# Patient Record
Sex: Female | Born: 1960 | Race: White | Hispanic: No | Smoking: Never smoker
Health system: Southern US, Community
[De-identification: ages and names within clinical notes are randomized; demographics above are authoritative.]

## PROBLEM LIST (undated history)

## (undated) DIAGNOSIS — M1990 Unspecified osteoarthritis, unspecified site: Secondary | ICD-10-CM

---

## 2006-07-02 ENCOUNTER — Encounter: Admission: RE | Admit: 2006-07-02 | Discharge: 2006-07-02 | Payer: Self-pay | Admitting: Occupational Medicine

## 2006-10-16 ENCOUNTER — Ambulatory Visit (HOSPITAL_COMMUNITY): Admission: RE | Admit: 2006-10-16 | Discharge: 2006-10-16 | Payer: Self-pay | Admitting: Orthopedic Surgery

## 2008-02-04 ENCOUNTER — Encounter: Admission: RE | Admit: 2008-02-04 | Discharge: 2008-02-04 | Payer: Self-pay | Admitting: Sports Medicine

## 2008-02-18 ENCOUNTER — Encounter: Admission: RE | Admit: 2008-02-18 | Discharge: 2008-02-18 | Payer: Self-pay | Admitting: Sports Medicine

## 2008-03-02 ENCOUNTER — Encounter: Admission: RE | Admit: 2008-03-02 | Discharge: 2008-03-02 | Payer: Self-pay | Admitting: Sports Medicine

## 2008-12-30 ENCOUNTER — Encounter: Admission: RE | Admit: 2008-12-30 | Discharge: 2008-12-30 | Payer: Self-pay | Admitting: Otolaryngology

## 2009-10-04 ENCOUNTER — Emergency Department (HOSPITAL_COMMUNITY): Admission: EM | Admit: 2009-10-04 | Discharge: 2009-10-04 | Payer: Self-pay | Admitting: Family Medicine

## 2010-06-06 ENCOUNTER — Encounter: Admission: RE | Admit: 2010-06-06 | Discharge: 2010-06-06 | Payer: Self-pay | Admitting: Otolaryngology

## 2010-11-21 ENCOUNTER — Inpatient Hospital Stay (INDEPENDENT_AMBULATORY_CARE_PROVIDER_SITE_OTHER)
Admission: RE | Admit: 2010-11-21 | Discharge: 2010-11-21 | Disposition: A | Discharge status: Home / Self Care | Payer: Medicaid Other | Source: Ambulatory Visit | Attending: Family Medicine | Admitting: Family Medicine

## 2010-11-21 DIAGNOSIS — J309 Allergic rhinitis, unspecified: Secondary | ICD-10-CM

## 2012-04-17 ENCOUNTER — Emergency Department (HOSPITAL_COMMUNITY)
Admission: EM | Admit: 2012-04-17 | Discharge: 2012-04-17 | Disposition: A | Discharge status: Home / Self Care | Payer: Medicaid Other | Source: Home / Self Care

## 2012-04-17 ENCOUNTER — Encounter (HOSPITAL_COMMUNITY): Payer: Self-pay

## 2012-04-17 DIAGNOSIS — J069 Acute upper respiratory infection, unspecified: Secondary | ICD-10-CM

## 2012-04-17 DIAGNOSIS — J309 Allergic rhinitis, unspecified: Secondary | ICD-10-CM

## 2012-04-17 NOTE — ED Notes (Signed)
C/o several day duration of congestion, sinus discomfort; NAD; c/o no relief w OTC medications

## 2012-04-17 NOTE — ED Provider Notes (Signed)
History     CSN: [id number]  Arrival date & time 04/17/12  1319   None     Chief Complaint  Patient presents with  . URI    (Consider location/radiation/quality/duration/timing/severity/associated sxs/prior treatment) Patient is a 51 y.o. female presenting with URI. The history is provided by the patient.  URI The primary symptoms include fatigue, cough and nausea. Primary symptoms do not include fever, headaches, ear pain, sore throat, swollen glands, wheezing, abdominal pain, vomiting, myalgias, arthralgias or rash. This is a recurrent problem. The problem has not changed since onset. Symptoms associated with the illness include congestion and rhinorrhea. The illness is not associated with chills, facial pain or sinus pressure.    History reviewed. No pertinent past medical history.  History reviewed. No pertinent past surgical history.  History reviewed. No pertinent family history.  History  Substance Use Topics  . Smoking status: Not on file  . Smokeless tobacco: Not on file  . Alcohol Use: Not on file    OB History    Grav Para Term Preterm Abortions TAB SAB Ect Mult Living                  Review of Systems  Constitutional: Positive for fatigue. Negative for fever and chills.  HENT: Positive for congestion and rhinorrhea. Negative for ear pain, sore throat and sinus pressure.   Respiratory: Positive for cough. Negative for chest tightness and wheezing.   Cardiovascular: Negative for chest pain and palpitations.  Gastrointestinal: Positive for nausea. Negative for vomiting and abdominal pain.  Musculoskeletal: Negative for myalgias and arthralgias.  Skin: Negative for rash and wound.  Neurological: Negative for syncope, numbness and headaches.    Allergies  Review of patient's allergies indicates no known allergies.  Home Medications  No current outpatient prescriptions on file.  BP 121/69  Pulse 95  Temp 98.6 F (37 C) (Oral)  Resp 14  SpO2  97%  Physical Exam  Constitutional: She is oriented to person, place, and time. She appears well-developed. No distress.  HENT:  Right Ear: External ear normal.  Left Ear: External ear normal.  Nose: Nose normal.  Mouth/Throat: Oropharynx is clear and moist. No oropharyngeal exudate.  Eyes: EOM are normal. Pupils are equal, round, and reactive to light. Right eye exhibits discharge. Left eye exhibits no discharge.  Neck: Normal range of motion. Neck supple.  Cardiovascular: Normal rate and regular rhythm.   No murmur heard. Pulmonary/Chest: Effort normal and breath sounds normal. She has no wheezes.  Abdominal: Soft. There is no tenderness.  Musculoskeletal: Normal range of motion. She exhibits no tenderness.  Lymphadenopathy:    She has no cervical adenopathy.  Neurological: She is alert and oriented to person, place, and time. No cranial nerve deficit.  Skin: Skin is warm and dry.    ED Course  Procedures (including critical care time)  Labs Reviewed - No data to display No results found.   1. URI (upper respiratory infection)   2. Allergic rhinitis       MDM  Reassurance CPAP 10 mg one by mouth every 4-6 hours when necessary congestion Allegra 60 mg twice a day or 180 mg daily OTC medicatio Zaditor eye drops bid Reyes Ivan, NP 04/17/12 1449

## 2012-04-17 NOTE — ED Provider Notes (Signed)
Medical screening examination/treatment/procedure(s) were performed by non-physician practitioner and as supervising physician I was immediately available for consultation/collaboration.  Raynald Blend, MD 04/17/12 [phone]

## 2012-10-17 ENCOUNTER — Encounter (HOSPITAL_COMMUNITY): Payer: Self-pay | Admitting: Nurse Practitioner

## 2012-10-17 ENCOUNTER — Inpatient Hospital Stay (HOSPITAL_COMMUNITY)
Admission: EM | Admit: 2012-10-17 | Discharge: 2012-10-19 | DRG: 502 | Disposition: A | Discharge status: Home / Self Care | Payer: Medicaid Other | Attending: Orthopedic Surgery | Admitting: Orthopedic Surgery

## 2012-10-17 ENCOUNTER — Emergency Department (HOSPITAL_COMMUNITY): Payer: Medicaid Other

## 2012-10-17 DIAGNOSIS — S52509A Unspecified fracture of the lower end of unspecified radius, initial encounter for closed fracture: Principal | ICD-10-CM | POA: Diagnosis present

## 2012-10-17 DIAGNOSIS — W19XXXA Unspecified fall, initial encounter: Secondary | ICD-10-CM | POA: Diagnosis present

## 2012-10-17 DIAGNOSIS — Y92009 Unspecified place in unspecified non-institutional (private) residence as the place of occurrence of the external cause: Secondary | ICD-10-CM

## 2012-10-17 DIAGNOSIS — S52609A Unspecified fracture of lower end of unspecified ulna, initial encounter for closed fracture: Principal | ICD-10-CM | POA: Diagnosis present

## 2012-10-17 DIAGNOSIS — S52532A Colles' fracture of left radius, initial encounter for closed fracture: Secondary | ICD-10-CM

## 2012-10-17 LAB — BASIC METABOLIC PANEL
BUN: 10 mg/dL (ref 6–23)
CO2: 24 mEq/L (ref 19–32)
Calcium: 8.8 mg/dL (ref 8.4–10.5)
Chloride: 104 mEq/L (ref 96–112)
Creatinine, Ser: 0.51 mg/dL (ref 0.50–1.10)
GFR calc Af Amer: >90 mL/min (ref >90)
GFR calc Af Amer: >90 mL/min (ref >90)
GFR calc non Af Amer: >90 mL/min (ref >90)
GFR calc non Af Amer: >90 mL/min (ref >90)
Glucose, Bld: 133 mg/dL — ABNORMAL HIGH (ref 70–99)
Glucose, Bld: 137 mg/dL — ABNORMAL HIGH (ref 70–99)
Potassium: 4 mEq/L (ref 3.5–5.1)
Potassium: 3.8 mEq/L (ref 3.5–5.1)
Sodium: 139 mEq/L (ref 135–145)
Sodium: 140 mEq/L (ref 135–145)

## 2012-10-17 LAB — CBC
HCT: 31.5 % — ABNORMAL LOW (ref 36.0–46.0)
Hemoglobin: 10.7 g/dL — ABNORMAL LOW (ref 12.0–15.0)
MCH: 30.4 pg (ref 26.0–34.0)
MCHC: 34 g/dL (ref 30.0–36.0)
MCV: 89.5 fL (ref 78.0–100.0)
Platelets: 258 10*3/uL (ref 150–400)
RBC: 3.52 MIL/uL — ABNORMAL LOW (ref 3.87–5.11)
RDW: 12.4 % (ref 11.5–15.5)
WBC: 13.6 10*3/uL — ABNORMAL HIGH (ref 4.0–10.5)

## 2012-10-17 LAB — PROTIME-INR
INR: 1.06 (ref 0.00–1.49)
Prothrombin Time: 13.7 seconds (ref 11.6–15.2)

## 2012-10-17 LAB — APTT: aPTT: 28 seconds (ref 24–37)

## 2012-10-17 MED ORDER — OXYCODONE-ACETAMINOPHEN 5-325 MG PO TABS
1.0000 | ORAL_TABLET | ORAL | Status: DC | PRN
  Administered 2012-10-17 (×2): 1 via ORAL
  Administered 2012-10-18 – 2012-10-19 (×4): 2 via ORAL
  Filled 2012-10-17 (×2): qty 2
  Filled 2012-10-17: qty 1
  Filled 2012-10-17 (×2): qty 2
  Filled 2012-10-17: qty 1
  Filled 2012-10-17: qty 2

## 2012-10-17 MED ORDER — METHOCARBAMOL 500 MG PO TABS
500.0000 mg | ORAL_TABLET | Freq: Four times a day (QID) | ORAL | Status: DC | PRN
  Administered 2012-10-18: 500 mg via ORAL
  Filled 2012-10-17: qty 1

## 2012-10-17 MED ORDER — ONDANSETRON HCL 4 MG/2ML IJ SOLN
INTRAMUSCULAR | Status: AC
  Administered 2012-10-17: 4 mg
  Filled 2012-10-17: qty 2

## 2012-10-17 MED ORDER — KCL IN DEXTROSE-NACL 20-5-0.45 MEQ/L-%-% IV SOLN
INTRAVENOUS | Status: DC
  Administered 2012-10-17: 23:00:00 via INTRAVENOUS
  Filled 2012-10-17 (×4): qty 1000

## 2012-10-17 MED ORDER — HYDROMORPHONE HCL PF 1 MG/ML IJ SOLN
1.0000 mg | Freq: Once | INTRAMUSCULAR | Status: AC
  Administered 2012-10-17: 1 mg via INTRAVENOUS
  Filled 2012-10-17: qty 1

## 2012-10-17 MED ORDER — CHLORHEXIDINE GLUCONATE 4 % EX LIQD
60.0000 mL | Freq: Once | CUTANEOUS | Status: AC
  Administered 2012-10-18: 4 via TOPICAL
  Filled 2012-10-17: qty 60

## 2012-10-17 MED ORDER — VITAMIN C 500 MG PO TABS
1000.0000 mg | ORAL_TABLET | Freq: Every day | ORAL | Status: DC
  Administered 2012-10-19: 1000 mg via ORAL
  Filled 2012-10-17 (×3): qty 2

## 2012-10-17 MED ORDER — ONDANSETRON HCL 4 MG/2ML IJ SOLN
4.0000 mg | Freq: Once | INTRAMUSCULAR | Status: AC
  Administered 2012-10-17: 4 mg via INTRAVENOUS
  Filled 2012-10-17: qty 2

## 2012-10-17 MED ORDER — DIPHENHYDRAMINE HCL 25 MG PO CAPS: 25.0000 mg | ORAL_CAPSULE | Freq: Four times a day (QID) | ORAL | Status: DC | PRN

## 2012-10-17 MED ORDER — ONDANSETRON HCL 4 MG/2ML IJ SOLN
4.0000 mg | Freq: Four times a day (QID) | INTRAMUSCULAR | Status: DC | PRN
  Administered 2012-10-17: 4 mg via INTRAVENOUS
  Filled 2012-10-17: qty 2

## 2012-10-17 MED ORDER — METHOCARBAMOL 100 MG/ML IJ SOLN
500.0000 mg | Freq: Four times a day (QID) | INTRAVENOUS | Status: DC | PRN
  Filled 2012-10-17: qty 5

## 2012-10-17 MED ORDER — FENTANYL CITRATE 0.05 MG/ML IJ SOLN
100.0000 ug | Freq: Once | INTRAMUSCULAR | Status: AC
  Administered 2012-10-17: 100 ug via INTRAVENOUS
  Filled 2012-10-17: qty 2

## 2012-10-17 MED ORDER — HYDROCODONE-ACETAMINOPHEN 5-325 MG PO TABS: 1.0000 | ORAL_TABLET | ORAL | Status: DC | PRN

## 2012-10-17 MED ORDER — MORPHINE SULFATE 2 MG/ML IJ SOLN: 1.0000 mg | INTRAMUSCULAR | Status: DC | PRN

## 2012-10-17 MED ORDER — DOCUSATE SODIUM 100 MG PO CAPS
100.0000 mg | ORAL_CAPSULE | Freq: Two times a day (BID) | ORAL | Status: DC
  Administered 2012-10-17 – 2012-10-19 (×3): 100 mg via ORAL
  Filled 2012-10-17 (×6): qty 1

## 2012-10-17 MED ORDER — CEFAZOLIN SODIUM-DEXTROSE 2-3 GM-% IV SOLR
2.0000 g | INTRAVENOUS | Status: AC
  Administered 2012-10-18: 2 g via INTRAVENOUS
  Filled 2012-10-17: qty 50

## 2012-10-17 MED ORDER — ADULT MULTIVITAMIN W/MINERALS CH
1.0000 | ORAL_TABLET | Freq: Every day | ORAL | Status: DC
  Filled 2012-10-17: qty 1

## 2012-10-17 MED ORDER — ONDANSETRON HCL 4 MG PO TABS
4.0000 mg | ORAL_TABLET | Freq: Four times a day (QID) | ORAL | Status: DC | PRN
  Administered 2012-10-18: 4 mg via ORAL
  Filled 2012-10-17: qty 1

## 2012-10-17 NOTE — ED Notes (Signed)
Patient requested and received a glass of ice water.

## 2012-10-17 NOTE — ED Notes (Signed)
Pt fell onto L wrist just pta. Obvious deformity to L wrist. Attempts to wiggle digits, skin warm to touch, difficult to palpate pulse d/t deformity

## 2012-10-17 NOTE — ED Provider Notes (Signed)
History     CSN: [id number]  Arrival date & time 10/17/12  1438   First MD Initiated Contact with Patient 10/17/12 1502      Chief Complaint  Patient presents with  . Arm Injury    (Consider location/radiation/quality/duration/timing/severity/associated sxs/prior treatment) HPI Kelly [name] is a 70 female who presents to the ED for wrist pain. Earlier today she fell backwards and fell on her outstretched left arm with her palm down. She felt a sharp pain and states her arm "looks funny" now. She rates the pain is 10/10. Patient denies nausea, vomiting, weakness, neck pain, or syncope.   History reviewed. No pertinent past medical history.  History reviewed. No pertinent past surgical history.  History reviewed. No pertinent family history.  History  Substance Use Topics  . Smoking status: Never Smoker   . Smokeless tobacco: Not on file  . Alcohol Use: No    OB History   Grav Para Term Preterm Abortions TAB SAB Ect Mult Living                  Review of Systems All other systems negative except as documented in the HPI. All pertinent positives and negatives as reviewed in the HPI.  Allergies  Review of patient's allergies indicates no known allergies.  Home Medications   Current Outpatient Rx  Name  Route  Sig  Dispense  Refill  . acetaminophen (TYLENOL) 500 MG tablet   Oral   Take 1,000 mg by mouth every 6 (six) hours as needed for pain.         . cholecalciferol (VITAMIN D) 1000 UNITS tablet   Oral   Take 1,000 Units by mouth daily.         . diphenhydrAMINE (BENADRYL) 25 mg capsule   Oral   Take 25 mg by mouth every 6 (six) hours as needed for allergies.         Marland Kitchen etodolac (LODINE) 400 MG tablet   Oral   Take 400 mg by mouth 2 (two) times daily as needed (for pain).         . Multiple Vitamin (MULTIVITAMIN WITH MINERALS) TABS   Oral   Take 1 tablet by mouth daily.           BP 122/73  Pulse 96  Temp(Src) 98.3 F (36.8 C) (Oral)  Resp  16  SpO2 97%  LMP 10/05/2012  Physical Exam  Constitutional: She is oriented to person, place, and time. She appears well-developed and well-nourished.  HENT:  Head: Normocephalic and atraumatic.  Eyes: Pupils are equal, round, and reactive to light.  Cardiovascular: Normal rate and regular rhythm.   Pulses:      Radial pulses are 2+ on the right side.  Pulmonary/Chest: Effort normal and breath sounds normal.  Musculoskeletal:       Left elbow: She exhibits normal range of motion, no swelling and no deformity.       Left wrist: She exhibits decreased range of motion, tenderness, bony tenderness, swelling and deformity.       Arms: Neurological: She is alert and oriented to person, place, and time. No sensory deficit.    ED Course  Procedures (including critical care time)  Labs Reviewed - No data to display Dg Wrist Complete Left  10/17/2012  *RADIOLOGY REPORT*  Clinical Data: Fall  LEFT WRIST - COMPLETE 3+ VIEW  Comparison: 07/02/2006  Findings: Comminuted and dorsally displaced fracture distal radius. A true lateral was not obtained.  There  is a fracture of the ulnar styloid.  IMPRESSION: Colles fracture with dorsal displacement.   Original Report Authenticated By: [name], [name]     I spoke with Dr. Melvyn Novas who will be in to see the patient. Patient was made aware of the plan. All questions were addressed.   MDM          Carlyle Dolly, PA-C 10/18/12 1103

## 2012-10-17 NOTE — ED Notes (Signed)
Ortman, MD at bedside. 

## 2012-10-17 NOTE — H&P (Signed)
Kelly [name] is an 52 y.o. female.   Chief Complaint: Left wrist fracture after FOOSH HPI: Pt FELL AT HOME TODAY SUSTAINING CLOSED INJURY TO LEFT WRIST PT PRESENTED TO ED WITH INJURY AND DEFORMITY TO LEFT WRIST NO PRIOR HISTORY OR INJURY TO LEFT WRIST PT C/O OF PAIN TO LEFT WRIST NO SYNCOPE, CHEST PAIN  History reviewed. No pertinent past medical history.  History reviewed. No pertinent past surgical history.  History reviewed. No pertinent family history. Social History:  reports that she has never smoked. She does not have any smokeless tobacco history on file. She reports that she does not drink alcohol or use illicit drugs.  Allergies: No Known Allergies   Results for orders placed during the hospital encounter of 10/17/12 (from the past 48 hour(s))  CBC     Status: Abnormal   Collection Time    10/17/12  5:44 PM      Result Value Range   WBC 13.6 (*) 4.0 - 10.5 K/uL   RBC 3.52 (*) 3.87 - 5.11 MIL/uL   Hemoglobin 10.7 (*) 12.0 - 15.0 g/dL   HCT 78.2 (*) 95.6 - 21.3 %   MCV 89.5  78.0 - 100.0 fL   MCH 30.4  26.0 - 34.0 pg   MCHC 34.0  30.0 - 36.0 g/dL   RDW 08.6  57.8 - 46.9 %   Platelets 258  150 - 400 K/uL   Dg Wrist Complete Left  10/17/2012  *RADIOLOGY REPORT*  Clinical Data: Fall  LEFT WRIST - COMPLETE 3+ VIEW  Comparison: 07/02/2006  Findings: Comminuted and dorsally displaced fracture distal radius. A true lateral was not obtained.  There is a fracture of the ulnar styloid.  IMPRESSION: Colles fracture with dorsal displacement.   Original Report Authenticated By: [name], [name]     NO RECENT ILLNESSES OR HOSPITALIZATIONS  Blood pressure 122/73, pulse 96, temperature 98.3 F (36.8 C), temperature source Oral, resp. rate 16, last menstrual period 10/05/2012, SpO2 97.00%. General Appearance:  Alert, cooperative, no distress, appears stated age  Head:  Normocephalic, without obvious abnormality, atraumatic  Eyes:  Pupils equal, conjunctiva/corneas clear,        Throat: Lips, mucosa, and tongue normal; teeth and gums normal  Neck: No visible masses     Lungs:   respirations unlabored  Chest Wall:  No tenderness or deformity  Heart:  Regular rate and rhythm,  Abdomen:   Soft, non-tender,         Extremities: LEFT WRIST: OBVIOUS DEFORMITY TO LEFT WRIST SKIN INTACT, GENTLE MANIPULATION PERFORMED AT BEDSIDE, PT TOLERATED. REDUCED SIGNIFICANT DEFORMITY TO LEFT WRIST. ABLE TO EXTEND THUMB FINGERS WARM WELL PERFUSED LIMITED WRIST AND DIGITAL MOBILITY ABLE TO FLEX AND EXTEND ELBOW, NO SHOULDER PAIN TO TOUCH  Pulses: 2+ and symmetric  Skin: Skin color, texture, turgor normal, no rashes or lesions     Neurologic: Normal    Assessment/Plan CLOSED LEFT COMMINUTED INTRA-ARTICULAR DISTAL RADIUS FRACTURE, DISPLACED WITH CONCOMINANT ULNAR STYLOID FRACTURE  DISCUSSED OPTIONS WITH PATIENT AND FAMILY PRESENT AT BEDSIDE RECOMMEND SURGERY ON LEFT WRIST WILL PLAN FOR ORIF OF UNSTABLE COMMINUTED DISTAL RADIUS FRACTURE IN AM WILL ADMIT FOR PAIN CONTROL AND FOR SURGERY IN AM PT VOICED UNDERSTANDING OF PLAN. SUGARTONG SPLINT APPLIED ICE, ELEVATION   Kelly [name] 10/17/2012, 6:34 PM

## 2012-10-17 NOTE — ED Notes (Signed)
Confirmed with Melvyn Novas, MD that pt is NPO after midnight and may eat dinner.  Dinner tray ordered.

## 2012-10-17 NOTE — Progress Notes (Signed)
Orthopedic Tech Progress Note Patient Details:  Kelly [name] [birth year] [id number]  Ortho Devices Type of Ortho Device: Arm sling;Sugartong splint;Ace wrap Ortho Device/Splint Location: (L) UE Ortho Device/Splint Interventions: Application;Ordered   Jennye Moccasin 10/17/2012, 7:42 PM

## 2012-10-18 ENCOUNTER — Encounter (HOSPITAL_COMMUNITY): Payer: Self-pay | Admitting: Anesthesiology

## 2012-10-18 ENCOUNTER — Inpatient Hospital Stay (HOSPITAL_COMMUNITY): Payer: Medicaid Other | Admitting: Anesthesiology

## 2012-10-18 ENCOUNTER — Encounter (HOSPITAL_COMMUNITY)
Admission: EM | Disposition: A | Discharge status: Home / Self Care | Payer: Self-pay | Source: Home / Self Care | Attending: Orthopedic Surgery

## 2012-10-18 HISTORY — PX: OPEN REDUCTION INTERNAL FIXATION (ORIF) DISTAL RADIAL FRACTURE: SHX5989

## 2012-10-18 LAB — MRSA PCR SCREENING: MRSA by PCR: POSITIVE — AB

## 2012-10-18 SURGERY — OPEN REDUCTION INTERNAL FIXATION (ORIF) DISTAL RADIUS FRACTURE
Anesthesia: Regional | Site: Wrist | Laterality: Left | Wound class: Clean

## 2012-10-18 MED ORDER — VITAMIN D3 25 MCG (1000 UNIT) PO TABS
1000.0000 [IU] | ORAL_TABLET | Freq: Every day | ORAL | Status: DC
  Administered 2012-10-18 – 2012-10-19 (×2): 1000 [IU] via ORAL
  Filled 2012-10-18 (×2): qty 1

## 2012-10-18 MED ORDER — 0.9 % SODIUM CHLORIDE (POUR BTL) OPTIME
TOPICAL | Status: DC | PRN
  Administered 2012-10-18: 1000 mL

## 2012-10-18 MED ORDER — BUPIVACAINE-EPINEPHRINE PF 0.5-1:200000 % IJ SOLN
INTRAMUSCULAR | Status: DC | PRN
  Administered 2012-10-18: 30 mL

## 2012-10-18 MED ORDER — FENTANYL CITRATE 0.05 MG/ML IJ SOLN
INTRAMUSCULAR | Status: DC | PRN
  Administered 2012-10-18: 50 ug via INTRAVENOUS
  Administered 2012-10-18: 25 ug via INTRAVENOUS

## 2012-10-18 MED ORDER — ONDANSETRON HCL 4 MG/2ML IJ SOLN: 4.0000 mg | Freq: Four times a day (QID) | INTRAMUSCULAR | Status: DC | PRN

## 2012-10-18 MED ORDER — HYDROMORPHONE HCL PF 1 MG/ML IJ SOLN: 0.2500 mg | INTRAMUSCULAR | Status: DC | PRN

## 2012-10-18 MED ORDER — OXYCODONE-ACETAMINOPHEN 5-325 MG PO TABS: 1.0000 | ORAL_TABLET | ORAL | Status: DC | PRN

## 2012-10-18 MED ORDER — DIPHENHYDRAMINE HCL 25 MG PO CAPS: 25.0000 mg | ORAL_CAPSULE | Freq: Four times a day (QID) | ORAL | Status: DC | PRN

## 2012-10-18 MED ORDER — CHLORHEXIDINE GLUCONATE CLOTH 2 % EX PADS
6.0000 | MEDICATED_PAD | Freq: Every day | CUTANEOUS | Status: DC
  Administered 2012-10-18: 6 via TOPICAL

## 2012-10-18 MED ORDER — PROPOFOL 10 MG/ML IV BOLUS
INTRAVENOUS | Status: DC | PRN
  Administered 2012-10-18: 160 mg via INTRAVENOUS

## 2012-10-18 MED ORDER — ADULT MULTIVITAMIN W/MINERALS CH
1.0000 | ORAL_TABLET | Freq: Every day | ORAL | Status: DC
  Administered 2012-10-18 – 2012-10-19 (×2): 1 via ORAL
  Filled 2012-10-18 (×2): qty 1

## 2012-10-18 MED ORDER — PHENYLEPHRINE HCL 10 MG/ML IJ SOLN
INTRAMUSCULAR | Status: DC | PRN
  Administered 2012-10-18 (×8): 40 ug via INTRAVENOUS

## 2012-10-18 MED ORDER — MUPIROCIN 2 % EX OINT
1.0000 "application " | TOPICAL_OINTMENT | Freq: Two times a day (BID) | CUTANEOUS | Status: DC
  Filled 2012-10-18 (×2): qty 22

## 2012-10-18 MED ORDER — BUPIVACAINE HCL 0.5 % IJ SOLN
INTRAMUSCULAR | Status: DC | PRN
  Administered 2012-10-18: 25 mL

## 2012-10-18 MED ORDER — LIDOCAINE HCL (CARDIAC) 20 MG/ML IV SOLN
INTRAVENOUS | Status: DC | PRN
  Administered 2012-10-18: 80 mg via INTRAVENOUS

## 2012-10-18 MED ORDER — CEFAZOLIN SODIUM 1-5 GM-% IV SOLN
1.0000 g | Freq: Three times a day (TID) | INTRAVENOUS | Status: AC
  Administered 2012-10-18 – 2012-10-19 (×3): 1 g via INTRAVENOUS
  Filled 2012-10-18 (×4): qty 50

## 2012-10-18 MED ORDER — OXYCODONE HCL 5 MG/5ML PO SOLN: 5.0000 mg | Freq: Once | ORAL | Status: AC | PRN

## 2012-10-18 MED ORDER — LACTATED RINGERS IV SOLN
INTRAVENOUS | Status: DC | PRN
  Administered 2012-10-18 (×2): via INTRAVENOUS

## 2012-10-18 MED ORDER — MIDAZOLAM HCL 5 MG/5ML IJ SOLN
INTRAMUSCULAR | Status: DC | PRN
  Administered 2012-10-18: 1 mg via INTRAVENOUS
  Administered 2012-10-18 (×2): .5 mg via INTRAVENOUS

## 2012-10-18 MED ORDER — ONDANSETRON HCL 4 MG/2ML IJ SOLN
INTRAMUSCULAR | Status: DC | PRN
  Administered 2012-10-18: 4 mg via INTRAVENOUS

## 2012-10-18 MED ORDER — OXYCODONE HCL 5 MG PO TABS
5.0000 mg | ORAL_TABLET | Freq: Once | ORAL | Status: AC | PRN
  Administered 2012-10-18: 5 mg via ORAL
  Filled 2012-10-18: qty 1

## 2012-10-18 SURGICAL SUPPLY — 64 items
BANDAGE ELASTIC 3 VELCRO ST LF (GAUZE/BANDAGES/DRESSINGS) ×2 IMPLANT
BANDAGE ELASTIC 4 VELCRO ST LF (GAUZE/BANDAGES/DRESSINGS) ×2 IMPLANT
BANDAGE GAUZE ELAST BULKY 4 IN (GAUZE/BANDAGES/DRESSINGS) ×2 IMPLANT
BIT DRILL 2 FAST STEP (BIT) ×1 IMPLANT
BIT DRILL 2.5X4 QC (BIT) ×1 IMPLANT
BLADE SURG ROTATE 9660 (MISCELLANEOUS) IMPLANT
BNDG CMPR 9X4 STRL LF SNTH (GAUZE/BANDAGES/DRESSINGS) ×1
BNDG ESMARK 4X9 LF (GAUZE/BANDAGES/DRESSINGS) ×2 IMPLANT
CLOTH BEACON ORANGE TIMEOUT ST (SAFETY) ×2 IMPLANT
CORDS BIPOLAR (ELECTRODE) ×2 IMPLANT
COVER SURGICAL LIGHT HANDLE (MISCELLANEOUS) ×2 IMPLANT
CUFF TOURNIQUET SINGLE 18IN (TOURNIQUET CUFF) ×2 IMPLANT
CUFF TOURNIQUET SINGLE 24IN (TOURNIQUET CUFF) IMPLANT
DRAIN TLS ROUND 10FR (DRAIN) IMPLANT
DRAPE OEC MINIVIEW 54X84 (DRAPES) ×2 IMPLANT
DRAPE SURG 17X11 SM STRL (DRAPES) ×2 IMPLANT
DRSG ADAPTIC 3X8 NADH LF (GAUZE/BANDAGES/DRESSINGS) ×1 IMPLANT
DRSG EMULSION OIL 3X3 NADH (GAUZE/BANDAGES/DRESSINGS) ×1 IMPLANT
ELECT REM PT RETURN 9FT ADLT (ELECTROSURGICAL) ×2
ELECTRODE REM PT RTRN 9FT ADLT (ELECTROSURGICAL) IMPLANT
GAUZE SPONGE 4X4 16PLY XRAY LF (GAUZE/BANDAGES/DRESSINGS) ×2 IMPLANT
GLOVE BIOGEL PI IND STRL 8.5 (GLOVE) ×1 IMPLANT
GLOVE BIOGEL PI INDICATOR 8.5 (GLOVE) ×1
GLOVE SURG ORTHO 8.0 STRL STRW (GLOVE) ×2 IMPLANT
GOWN PREVENTION PLUS XLARGE (GOWN DISPOSABLE) ×2 IMPLANT
GOWN STRL NON-REIN LRG LVL3 (GOWN DISPOSABLE) ×5 IMPLANT
KIT BASIN OR (CUSTOM PROCEDURE TRAY) ×2 IMPLANT
KIT ROOM TURNOVER OR (KITS) ×2 IMPLANT
MANIFOLD NEPTUNE II (INSTRUMENTS) ×1 IMPLANT
NDL HYPO 25X1 1.5 SAFETY (NEEDLE) ×1 IMPLANT
NEEDLE HYPO 25X1 1.5 SAFETY (NEEDLE) ×2 IMPLANT
NS IRRIG 1000ML POUR BTL (IV SOLUTION) ×2 IMPLANT
PACK ORTHO EXTREMITY (CUSTOM PROCEDURE TRAY) ×2 IMPLANT
PAD ARMBOARD 7.5X6 YLW CONV (MISCELLANEOUS) ×4 IMPLANT
PAD CAST 3X4 CTTN HI CHSV (CAST SUPPLIES) IMPLANT
PAD CAST 4YDX4 CTTN HI CHSV (CAST SUPPLIES) ×1 IMPLANT
PADDING CAST COTTON 3X4 STRL (CAST SUPPLIES) ×2
PADDING CAST COTTON 4X4 STRL (CAST SUPPLIES) ×2
PEG SUBCHONDRAL SMOOTH 2.0X16 (Peg) ×2 IMPLANT
PEG SUBCHONDRAL SMOOTH 2.0X18 (Peg) ×2 IMPLANT
PEG SUBCHONDRAL SMOOTH 2.0X20 (Peg) ×3 IMPLANT
PLATE SHORT 24.4X51.3 LT (Plate) ×1 IMPLANT
SCREW BN 13X3.5XNS CORT TI (Screw) IMPLANT
SCREW CORT 3.5X13 (Screw) ×2 IMPLANT
SOAP 2 % CHG 4 OZ (WOUND CARE) ×3 IMPLANT
SPLINT FIBERGLASS 3X35 (CAST SUPPLIES) ×1 IMPLANT
SPONGE GAUZE 4X4 12PLY (GAUZE/BANDAGES/DRESSINGS) ×2 IMPLANT
SPONGE LAP 4X18 X RAY DECT (DISPOSABLE) ×1 IMPLANT
STRIP CLOSURE SKIN 1/2X4 (GAUZE/BANDAGES/DRESSINGS) IMPLANT
SUCTION FRAZIER TIP 10 FR DISP (SUCTIONS) ×2 IMPLANT
SUT ETHILON 4 0 PS 2 18 (SUTURE) ×1 IMPLANT
SUT MNCRL AB 4-0 PS2 18 (SUTURE) IMPLANT
SUT PROLENE 4 0 PS 2 18 (SUTURE) ×1 IMPLANT
SUT VIC AB 2-0 FS1 27 (SUTURE) ×2 IMPLANT
SUT VICRYL 4-0 PS2 18IN ABS (SUTURE) ×2 IMPLANT
SYR CONTROL 10ML LL (SYRINGE) ×1 IMPLANT
SYSTEM CHEST DRAIN TLS 7FR (DRAIN) IMPLANT
TOWEL OR 17X24 6PK STRL BLUE (TOWEL DISPOSABLE) ×2 IMPLANT
TOWEL OR 17X26 10 PK STRL BLUE (TOWEL DISPOSABLE) ×2 IMPLANT
TUBE CONNECTING 12X1/4 (SUCTIONS) ×2 IMPLANT
WATER STERILE IRR 1000ML POUR (IV SOLUTION) ×1 IMPLANT
YANKAUER SUCT BULB TIP NO VENT (SUCTIONS) IMPLANT
biomet dvr peg smooth ×1 IMPLANT
biomet dvr peg smooth 2.0x10mm ×1 IMPLANT

## 2012-10-18 NOTE — Progress Notes (Signed)
R/B/A DISCUSSED WITH PT IN ED.  PT VOICED UNDERSTANDING OF PLAN CONSENT SIGNED DAY OF SURGERY PT SEEN AND EXAMINED PRIOR TO OPERATIVE PROCEDURE/DAY OF SURGERY SITE MARKED. QUESTIONS ANSWERED WILL REMAIN AN INPATIENT FOLLOWING SURGERY

## 2012-10-18 NOTE — Brief Op Note (Signed)
10/17/2012 - 10/18/2012  8:13 AM  PATIENT:  Kelly [name]  52 y.o. female  PRE-OPERATIVE DIAGNOSIS:  left distal radial fracture  POST-OPERATIVE DIAGNOSIS:  SAME  PROCEDURE:  Procedure(s): OPEN REDUCTION INTERNAL FIXATION (ORIF) DISTAL RADIAL FRACTURE (Left)  SURGEON:  Surgeon(s) and Role:    * Sharma Covert, MD - Primary  PHYSICIAN ASSISTANT: NONE  ASSISTANTS: none   ANESTHESIA:   general  EBL:   none  BLOOD ADMINISTERED:none  DRAINS: none   LOCAL MEDICATIONS USED:  MARCAINE     SPECIMEN:  No Specimen  DISPOSITION OF SPECIMEN:  N/A  COUNTS:  YES  TOURNIQUET:    DICTATION: .Note written in EPIC  PLAN OF CARE: Admit for overnight observation  PATIENT DISPOSITION:  PACU - hemodynamically stable.   Delay start of Pharmacological VTE agent (>24hrs) due to surgical blood loss or risk of bleeding: not applicable

## 2012-10-18 NOTE — Transfer of Care (Signed)
Immediate Anesthesia Transfer of Care Note  Patient: Kelly [name]  Procedure(s) Performed: Procedure(s): OPEN REDUCTION INTERNAL FIXATION (ORIF) DISTAL RADIAL FRACTURE (Left)  Patient Location: PACU  Anesthesia Type:GA combined with regional for post-op pain  Level of Consciousness: sedated  Airway & Oxygen Therapy: Patient Spontanous Breathing and Patient connected to nasal cannula oxygen  Post-op Assessment: Report given to PACU RN  Post vital signs: Reviewed and stable  Complications: No apparent anesthesia complications

## 2012-10-18 NOTE — ED Provider Notes (Signed)
Medical screening examination/treatment/procedure(s) were performed by non-physician practitioner and as supervising physician I was immediately available for consultation/collaboration.   David H Yao, MD 10/18/12 1500 

## 2012-10-18 NOTE — Anesthesia Procedure Notes (Addendum)
Anesthesia Regional Block:  Supraclavicular block  Pre-Anesthetic Checklist: ,, timeout performed, Correct Patient, Correct Site, Correct Laterality, Correct Procedure, Correct Position, site marked, Risks and benefits discussed,  Surgical consent,  Pre-op evaluation,  At surgeon's request and post-op pain management  Laterality: Left  Prep: chloraprep       Needles:  Injection technique: Single-shot  Needle Type: Echogenic Stimulator Needle     Needle Length: 5cm 5 cm Needle Gauge: 22 and 22 G    Additional Needles:  Procedures: ultrasound guided (picture in chart) and nerve stimulator Supraclavicular block  Nerve Stimulator or Paresthesia:  Response: biceps flexion, 0.45 mA,   Additional Responses:   Narrative:  Start time: 10/18/2012 7:43 AM End time: 10/18/2012 7:53 AM Injection made incrementally with aspirations every 5 mL.  Performed by: Personally  Anesthesiologist: Dr Chaney Malling  Additional Notes: Functioning IV was confirmed and monitors were applied.  A 50mm 22ga Arrow echogenic stimulator needle was used. Sterile prep and drape,hand hygiene and sterile gloves were used.  Negative aspiration and negative test dose prior to incremental administration of local anesthetic. The patient tolerated the procedure well.  Ultrasound guidance: relevent anatomy identified, needle position confirmed, local anesthetic spread visualized around nerve(s), vascular puncture avoided.  Image printed for medical record.   Supraclavicular block Procedure Name: LMA Insertion Date/Time: 10/18/2012 8:20 AM Performed by: Edmonia Caprio Pre-anesthesia Checklist: Patient identified, Emergency Drugs available, Suction available, Patient being monitored and Timeout performed Patient Re-evaluated:Patient Re-evaluated prior to inductionOxygen Delivery Method: Circle system utilized Preoxygenation: Pre-oxygenation with 100% oxygen Intubation Type: IV induction Ventilation: Mask ventilation  without difficulty LMA: LMA inserted LMA Size: 4.0 Number of attempts: 1 Placement Confirmation: breath sounds checked- equal and bilateral and positive ETCO2 Tube secured with: Tape Dental Injury: Teeth and Oropharynx as per pre-operative assessment

## 2012-10-18 NOTE — Preoperative (Signed)
Beta Blockers   Reason not to administer Beta Blockers:Not Applicable 

## 2012-10-18 NOTE — Anesthesia Postprocedure Evaluation (Signed)
Anesthesia Post Note  Patient: Kelly [name]  Procedure(s) Performed: Procedure(s) (LRB): OPEN REDUCTION INTERNAL FIXATION (ORIF) DISTAL RADIAL FRACTURE (Left)  Anesthesia type: General  Patient location: PACU  Post pain: Pain level controlled and Adequate analgesia  Post assessment: Post-op Vital signs reviewed, Patient's Cardiovascular Status Stable, Respiratory Function Stable, Patent Airway and Pain level controlled  Last Vitals:  Filed Vitals:   10/18/12 1000  BP: 114/76  Pulse: 95  Temp:   Resp: 16    Post vital signs: Reviewed and stable  Level of consciousness: awake, alert  and oriented  Complications: No apparent anesthesia complications

## 2012-10-18 NOTE — Anesthesia Preprocedure Evaluation (Signed)
Anesthesia Evaluation  Patient identified by MRN, date of birth, ID band Patient awake    Reviewed: Allergy & Precautions, H&P , NPO status , Patient's Chart, lab work & pertinent test results  Airway Mallampati: II  Neck ROM: full    Dental   Pulmonary          Cardiovascular     Neuro/Psych    GI/Hepatic   Endo/Other    Renal/GU      Musculoskeletal   Abdominal   Peds  Hematology   Anesthesia Other Findings   Reproductive/Obstetrics                           Anesthesia Physical Anesthesia Plan  ASA: I  Anesthesia Plan: General and Regional   Post-op Pain Management: MAC Combined w/ Regional for Post-op pain   Induction: Intravenous  Airway Management Planned: LMA  Additional Equipment:   Intra-op Plan:   Post-operative Plan:   Informed Consent: I have reviewed the patients History and Physical, chart, labs and discussed the procedure including the risks, benefits and alternatives for the proposed anesthesia with the patient or authorized representative who has indicated his/her understanding and acceptance.     Plan Discussed with: CRNA and Surgeon  Anesthesia Plan Comments:         Anesthesia Quick Evaluation

## 2012-10-18 NOTE — Op Note (Signed)
PREOPERATIVE DIAGNOSIS: Left wrist intra-articular distal radius  fracture, 3 or more fragments.   POSTOPERATIVE DIAGNOSIS: Left wrist intra-articular distal radius  fracture, 3 or more fragments.   ATTENDING PHYSICIAN: Sharma Covert IV, MD who scrubbed and present  entire procedure.   ASSISTANT SURGEON: None.   ANESTHESIA:  general anesthesia LMA  SURGICAL IMPLANTS: [employer] DVR plate, standard with seven distal locking pegs and 3 bicortical screws proximally  SURGICAL PROCEDURE:  1. Open treatment of left wrist intra-articular distal radius  fracture, 3 or more fragments.  2. Left wrist brachioradialis tenotomy and release.  3. Radiographs, stress radiographs, left wrist.   SURGICAL INDICATIONS: Kelly [name]  is a right-hand-dominant female who sustained an intra-articular distal radius fracture after a fall. The  patient was seen and evaluated in the ED based on degree of  displacement and the  displacement, recommended that she undergo  the above procedure. Risks, benefits, and alternatives were discussed  in detail with the patient. Signed informed consent was obtained.  Risks include, but not limited to bleeding, infection, damage to nearby  nerves, arteries, or tendons, nonunion, malunion, hardware failure, loss  of motion of the elbow, wrist, and digits, and need for further surgical  intervention.   PROCEDURE: The patient was properly identified in the preop holding  area. A mark with a permanent marker was made on the left wrist to  indicate correct operative site. The patient was then brought back to the  operating room. The patient received preoperative antibiotics. General  anesthesia was induced. Left upper extremity was prepped and draped in  normal sterile fashion. Time-out was called. Correct site was  identified, and procedure then begun. Attention was then turned to the  left wrist. The limb was then elevated using Esmarch exsanguination and   tourniquet insufflated. A longitudinal incision was made directly over  the FCR sheath. Dissection was then carried down through the skin and  subcutaneous tissue. The FCR sheath was then opened proximally and  distally. Careful dissection was [name] going through the floor of the  FCR sheath where the FPL was identified. An L-shaped pronator quadratus  flap was then elevated. In order to aid in reduction tenotomy of the brachioradialis was completed with protection of the first dorsal compartment tendons.  The fracture site was then opened and the patient did have several fracture fragment extending into the joint greater than 3 part intra-articular fracture. Careful open reduction was then carried out. The appropriate size dvr plate was chosen.. The oblong screw hole was then drilled with a 2.5 mm drill bit, then 3.5 mm bicortical screw. Plate height was adjusted.   After position was then confirmed using mini C-arm, the distal row fixation was then carried out with the beginning from an ulnar to radial direction with the  locking pegs. The total of 7 locking pegs were then placed. Following this, attention was then turned proximally where 2 more nonlocking screws were then placed in the 2.5 mm drill bit, the 3.5 mm non-locking screws.   The wound was then thoroughly irrigated. Final stress radiography was then carried out.  Stress radiographs were then obtained under live fluoro showing no widening of the SL interval. I did not see any carpal dissociation with good fixation, without any evidence of penetration in the articular margin with the locking pegs.    Postop, the pronator quadratus was then closed with 2-0 Vicryl.  Tourniquet was then deflated. Hemostasis was then obtained. The  subcutaneous tissues closed  with 4-0 Vicryl and skin closed with simple  nylon sutures. Adaptic dressing and sterile compressive bandage was  then applied. The patient was then placed in a well-padded  volar splint. Extubated and taken to recovery room in good condition.    INTRAOPERATIVE RADIOGRAPHS:, 3 views of the wrist do show  the volar plate fixation in place. There is good position in both  planes.    POSTOPERATIVE PLAN: The patient will be admitted for IV antibiotics and  pain control; discharged in the morning. Seen back in the office for  approximately 10-14 days for wound check, suture removal, and then x-  rays, short-arm cast for total 4 weeks, and then begin a therapy regimen  around a 4-week mark. Radiographs at each visit.    Kelly Done, MD

## 2012-10-18 NOTE — Discharge Summary (Signed)
Physician Discharge Summary  Patient ID: Kelly [name] MRN: [id number] DOB/AGE: [birth year] 52 y.o.  Admit date: 11/10/2012 Discharge date: 10/19/2012  Admission Diagnoses: left distal radial fracture History reviewed. No pertinent past medical history.  Discharge Diagnoses:  Same  Surgeries: Procedure(s): OPEN REDUCTION INTERNAL FIXATION (ORIF) DISTAL RADIAL FRACTURE on 10-Nov-2012 - 10/18/2012    Consultants:  none  Discharged Condition: Improved  Hospital Course: Kelly [name] is an 52 y.o. female who was admitted 11/10/12 with a chief complaint of  Chief Complaint  Patient presents with  . Arm Injury  , and found to have a diagnosis of left distal radial fracture.  They were brought to the operating room on 11-10-12 - 10/18/2012 and underwent Procedure(s): OPEN REDUCTION INTERNAL FIXATION (ORIF) DISTAL RADIAL FRACTURE.    They were given perioperative antibiotics: Anti-infectives   Start     Dose/Rate Route Frequency Ordered Stop   10/18/12 1400  ceFAZolin (ANCEF) IVPB 1 g/50 mL premix     1 g 100 mL/hr over 30 Minutes Intravenous 3 times per day 10/18/12 1004 10/19/12 1359   10/18/12 0800  ceFAZolin (ANCEF) IVPB 2 g/50 mL premix     2 g 100 mL/hr over 30 Minutes Intravenous 60 min pre-op 11-10-2012 2107 10/18/12 0825    .  They were given sequential compression devices, early ambulation, and Other (comment) early ambulation for DVT prophylaxis.  Recent vital signs: Patient Vitals for the past 24 hrs:  BP Temp Temp src Pulse Resp SpO2  10/18/12 1030 114/72 mmHg - - 94 12 100 %  10/18/12 1015 116/73 mmHg - - 89 17 100 %  10/18/12 1000 114/76 mmHg - - 95 16 100 %  10/18/12 0942 115/77 mmHg 98.5 F (36.9 C) - 95 16 100 %  10/18/12 0703 115/70 mmHg 98.2 F (36.8 C) Oral 87 17 99 %  11-10-12 2142 118/70 mmHg 98.6 F (37 C) Oral 81 16 97 %  11/10/12 1906 111/72 mmHg 98.6 F (37 C) Oral 85 17 96 %  November 10, 2012 1545 122/73 mmHg - - 96 - 97 %  Nov 10, 2012 1442 130/89 mmHg 98.3 F  (36.8 C) Oral 95 16 100 %  .  Recent laboratory studies: Dg Wrist Complete Left  2012/11/10  *RADIOLOGY REPORT*  Clinical Data: Fall  LEFT WRIST - COMPLETE 3+ VIEW  Comparison: 07/02/2006  Findings: Comminuted and dorsally displaced fracture distal radius. A true lateral was not obtained.  There is a fracture of the ulnar styloid.  IMPRESSION: Colles fracture with dorsal displacement.   Original Report Authenticated By: [name], [name]     Discharge Medications:     Medication List    TAKE these medications       acetaminophen 500 MG tablet  Commonly known as:  TYLENOL  Take 1,000 mg by mouth every 6 (six) hours as needed for pain.     cholecalciferol 1000 UNITS tablet  Commonly known as:  VITAMIN D  Take 1,000 Units by mouth daily.     diphenhydrAMINE 25 mg capsule  Commonly known as:  BENADRYL  Take 25 mg by mouth every 6 (six) hours as needed for allergies.     etodolac 400 MG tablet  Commonly known as:  LODINE  Take 400 mg by mouth 2 (two) times daily as needed (for pain).     multivitamin with minerals Tabs  Take 1 tablet by mouth daily.     oxyCODONE-acetaminophen 5-325 MG per tablet  Commonly known as:  ROXICET  Take 1  tablet by mouth every 4 (four) hours as needed for pain.        Diagnostic Studies: Dg Wrist Complete Left  11-12-2012  *RADIOLOGY REPORT*  Clinical Data: Fall  LEFT WRIST - COMPLETE 3+ VIEW  Comparison: 07/02/2006  Findings: Comminuted and dorsally displaced fracture distal radius. A true lateral was not obtained.  There is a fracture of the ulnar styloid.  IMPRESSION: Colles fracture with dorsal displacement.   Original Report Authenticated By: [name], [name]     They benefited maximally from their hospital stay and there were no complications.     Disposition: 01-Home or Self Care     Signed: Sharma Covert 10/18/2012, 10:50 AM

## 2012-10-20 ENCOUNTER — Encounter (HOSPITAL_COMMUNITY): Payer: Self-pay | Admitting: Orthopedic Surgery

## 2012-11-25 ENCOUNTER — Ambulatory Visit: Payer: Medicaid Other | Attending: Orthopedic Surgery | Admitting: Occupational Therapy

## 2012-11-25 DIAGNOSIS — IMO0001 Reserved for inherently not codable concepts without codable children: Secondary | ICD-10-CM | POA: Insufficient documentation

## 2012-11-25 DIAGNOSIS — M25549 Pain in joints of unspecified hand: Secondary | ICD-10-CM | POA: Insufficient documentation

## 2012-11-25 DIAGNOSIS — M2560 Stiffness of unspecified joint, not elsewhere classified: Secondary | ICD-10-CM | POA: Insufficient documentation

## 2012-11-25 DIAGNOSIS — Z4789 Encounter for other orthopedic aftercare: Secondary | ICD-10-CM | POA: Insufficient documentation

## 2012-12-03 ENCOUNTER — Ambulatory Visit: Payer: Medicaid Other | Attending: Orthopedic Surgery | Admitting: Occupational Therapy

## 2012-12-03 DIAGNOSIS — M2560 Stiffness of unspecified joint, not elsewhere classified: Secondary | ICD-10-CM | POA: Insufficient documentation

## 2012-12-03 DIAGNOSIS — IMO0001 Reserved for inherently not codable concepts without codable children: Secondary | ICD-10-CM | POA: Insufficient documentation

## 2012-12-03 DIAGNOSIS — Z4789 Encounter for other orthopedic aftercare: Secondary | ICD-10-CM | POA: Insufficient documentation

## 2012-12-03 DIAGNOSIS — M25549 Pain in joints of unspecified hand: Secondary | ICD-10-CM | POA: Insufficient documentation

## 2012-12-16 ENCOUNTER — Ambulatory Visit: Payer: Medicaid Other | Admitting: Occupational Therapy

## 2012-12-30 ENCOUNTER — Ambulatory Visit: Payer: Medicaid Other | Attending: Orthopedic Surgery | Admitting: Occupational Therapy

## 2012-12-30 DIAGNOSIS — Z4789 Encounter for other orthopedic aftercare: Secondary | ICD-10-CM | POA: Insufficient documentation

## 2012-12-30 DIAGNOSIS — IMO0001 Reserved for inherently not codable concepts without codable children: Secondary | ICD-10-CM | POA: Insufficient documentation

## 2012-12-30 DIAGNOSIS — M2560 Stiffness of unspecified joint, not elsewhere classified: Secondary | ICD-10-CM | POA: Insufficient documentation

## 2012-12-30 DIAGNOSIS — M25549 Pain in joints of unspecified hand: Secondary | ICD-10-CM | POA: Insufficient documentation

## 2014-06-01 ENCOUNTER — Other Ambulatory Visit: Payer: Self-pay | Admitting: Otolaryngology

## 2014-06-01 DIAGNOSIS — H61302 Acquired stenosis of left external ear canal, unspecified: Secondary | ICD-10-CM

## 2014-06-07 ENCOUNTER — Ambulatory Visit
Admission: RE | Admit: 2014-06-07 | Discharge: 2014-06-07 | Disposition: A | Discharge status: Home / Self Care | Payer: Medicaid Other | Source: Ambulatory Visit | Attending: Otolaryngology | Admitting: Otolaryngology

## 2014-06-07 DIAGNOSIS — H61302 Acquired stenosis of left external ear canal, unspecified: Secondary | ICD-10-CM

## 2015-08-05 ENCOUNTER — Encounter (HOSPITAL_COMMUNITY): Payer: Self-pay | Admitting: Emergency Medicine

## 2015-08-05 ENCOUNTER — Emergency Department (HOSPITAL_COMMUNITY)
Admission: EM | Admit: 2015-08-05 | Discharge: 2015-08-05 | Disposition: A | Discharge status: Home / Self Care | Payer: Medicaid Other | Attending: Emergency Medicine | Admitting: Emergency Medicine

## 2015-08-05 ENCOUNTER — Emergency Department (HOSPITAL_COMMUNITY): Payer: Medicaid Other

## 2015-08-05 DIAGNOSIS — M1990 Unspecified osteoarthritis, unspecified site: Secondary | ICD-10-CM | POA: Insufficient documentation

## 2015-08-05 DIAGNOSIS — M549 Dorsalgia, unspecified: Secondary | ICD-10-CM

## 2015-08-05 DIAGNOSIS — Z7982 Long term (current) use of aspirin: Secondary | ICD-10-CM | POA: Insufficient documentation

## 2015-08-05 DIAGNOSIS — M545 Low back pain: Secondary | ICD-10-CM | POA: Insufficient documentation

## 2015-08-05 DIAGNOSIS — Z79899 Other long term (current) drug therapy: Secondary | ICD-10-CM | POA: Insufficient documentation

## 2015-08-05 DIAGNOSIS — R911 Solitary pulmonary nodule: Secondary | ICD-10-CM | POA: Insufficient documentation

## 2015-08-05 DIAGNOSIS — M25551 Pain in right hip: Secondary | ICD-10-CM | POA: Insufficient documentation

## 2015-08-05 DIAGNOSIS — R109 Unspecified abdominal pain: Secondary | ICD-10-CM | POA: Insufficient documentation

## 2015-08-05 HISTORY — DX: Unspecified osteoarthritis, unspecified site: M19.90

## 2015-08-05 LAB — CBC WITH DIFFERENTIAL/PLATELET
BASOS ABS: 0 10*3/uL (ref 0.0–0.1)
BASOS PCT: 0 %
EOS ABS: 0.2 10*3/uL (ref 0.0–0.7)
EOS PCT: 3 %
HCT: 33.9 % — ABNORMAL LOW (ref 36.0–46.0)
Hemoglobin: 10.8 g/dL — ABNORMAL LOW (ref 12.0–15.0)
Lymphocytes Relative: 30 %
Lymphs Abs: 2 10*3/uL (ref 0.7–4.0)
MCH: 29.1 pg (ref 26.0–34.0)
MCHC: 31.9 g/dL (ref 30.0–36.0)
MCV: 91.4 fL (ref 78.0–100.0)
MONO ABS: 0.6 10*3/uL (ref 0.1–1.0)
Monocytes Relative: 9 %
Neutro Abs: 3.9 10*3/uL (ref 1.7–7.7)
Neutrophils Relative %: 58 %
PLATELETS: 314 10*3/uL (ref 150–400)
RBC: 3.71 MIL/uL — AB (ref 3.87–5.11)
RDW: 12.4 % (ref 11.5–15.5)
WBC: 6.7 10*3/uL (ref 4.0–10.5)

## 2015-08-05 LAB — BASIC METABOLIC PANEL
Anion gap: 9 (ref 5–15)
CHLORIDE: 100 mmol/L — AB (ref 101–111)
CO2: 28 mmol/L (ref 22–32)
CREATININE: 0.49 mg/dL (ref 0.44–1.00)
Calcium: 9.2 mg/dL (ref 8.9–10.3)
GFR calc Af Amer: >60 mL/min (ref >60)
GFR calc non Af Amer: >60 mL/min (ref >60)
GLUCOSE: 102 mg/dL — AB (ref 65–99)
POTASSIUM: 4.2 mmol/L (ref 3.5–5.1)
Sodium: 137 mmol/L (ref 135–145)

## 2015-08-05 LAB — URINALYSIS, ROUTINE W REFLEX MICROSCOPIC
BILIRUBIN URINE: NEGATIVE
GLUCOSE, UA: NEGATIVE mg/dL
Ketones, ur: NEGATIVE mg/dL
Leukocytes, UA: NEGATIVE
Nitrite: NEGATIVE
PH: 7.5 (ref 5.0–8.0)
Protein, ur: NEGATIVE mg/dL
SPECIFIC GRAVITY, URINE: 1.005 (ref 1.005–1.030)

## 2015-08-05 LAB — URINE MICROSCOPIC-ADD ON
BACTERIA UA: NONE SEEN
WBC, UA: NONE SEEN WBC/hpf (ref 0–5)

## 2015-08-05 MED ORDER — OXYCODONE-ACETAMINOPHEN 5-325 MG PO TABS
1.0000 | ORAL_TABLET | Freq: Once | ORAL | Status: AC
  Administered 2015-08-05: 1 via ORAL
  Filled 2015-08-05: qty 1

## 2015-08-05 MED ORDER — OXYCODONE-ACETAMINOPHEN 5-325 MG PO TABS: 1.0000 | ORAL_TABLET | ORAL | Status: AC | PRN

## 2015-08-05 NOTE — ED Notes (Signed)
Pt up and ambulated to the restroom independently.

## 2015-08-05 NOTE — ED Provider Notes (Signed)
CSN: [id number]     Arrival date & time 08/05/15  1520 History   First MD Initiated Contact with Patient 08/05/15 1528     Chief Complaint  Patient presents with  . Flank Pain     (Consider location/radiation/quality/duration/timing/severity/associated sxs/prior Treatment) Patient is a 55 y.o. female presenting with flank pain. The history is provided by the patient and medical records.  Flank Pain   55 y.o. F with hx of arthritis, presenting to the ED for right sided back pain.  Patient states this has been ongoing for the past several weeks.  Patient states she has increased pain with movement or bending over.  She states pain radiates into her right hip sometimes.  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  Denies injury, trauma, or falls.  States pain began after carrying a laundry basket on her right hip and trying to open the trunk of her car.  Denies abdominal pain, urinary symptoms, nausea, vomiting, diarrhea, fever, or chills.  No hx of kidney stones.  No abdominal surgeries.  No chest pain or SOB.  No intervention tried PTA.  VSS.  Past Medical History  Diagnosis Date  . Arthritis    Past Surgical History  Procedure Laterality Date  . Open reduction internal fixation (orif) distal radial fracture Left 10/18/2012    Procedure: OPEN REDUCTION INTERNAL FIXATION (ORIF) DISTAL RADIAL FRACTURE;  Surgeon: Sharma CovertFred W Ortmann, MD;  Location: [facility] OR;  Service: Orthopedics;  Laterality: Left;   History reviewed. No pertinent family history. Social History  Substance Use Topics  . Smoking status: Never Smoker   . Smokeless tobacco: None  . Alcohol Use: No   OB History    No data available     Review of Systems  Musculoskeletal: Positive for back pain.  All other systems reviewed and are negative.     Allergies  Review of patient's allergies indicates no known allergies.  Home Medications   Prior to Admission medications   Medication Sig Start Date End Date Taking? Authorizing  Provider  acetaminophen (TYLENOL) 500 MG tablet Take 1,000 mg by mouth every 6 (six) hours as needed for pain.   Yes Historical Provider, MD  aspirin-acetaminophen-caffeine (EXCEDRIN EXTRA STRENGTH) 7876630144250-250-65 MG tablet Take 1 tablet by mouth every 6 (six) hours as needed for headache.   Yes Historical Provider, MD  cholecalciferol (VITAMIN D) 1000 UNITS tablet Take 1,000 Units by mouth daily.   Yes Historical Provider, MD  diphenhydrAMINE (BENADRYL) 25 mg capsule Take 25 mg by mouth every 6 (six) hours as needed for allergies.   Yes Historical Provider, MD  Multiple Vitamin (MULTIVITAMIN WITH MINERALS) TABS Take 1 tablet by mouth daily.   Yes Historical Provider, MD  etodolac (LODINE) 400 MG tablet Take 400 mg by mouth 2 (two) times daily as needed (for pain).    Historical Provider, MD  oxyCODONE-acetaminophen (ROXICET) 5-325 MG per tablet Take 1 tablet by mouth every 4 (four) hours as needed for pain. Patient not taking: Reported on 08/05/2015 10/18/12   Bradly BienenstockFred Ortmann, MD   BP 129/95 mmHg  Pulse 121  Temp(Src) 97.8 F (36.6 C) (Oral)  Resp 16  Ht 5\' 4"  (1.626 m)  Wt 47.628 kg  BMI 18.01 kg/m2  SpO2 100%  LMP 10/05/2012   Physical Exam  Constitutional: She is oriented to person, place, and time. She appears well-developed and well-nourished. No distress.  HENT:  Head: Normocephalic and atraumatic.  Mouth/Throat: Oropharynx is clear and moist.  Eyes: Conjunctivae and  EOM are normal. Pupils are equal, round, and reactive to light.  Neck: Normal range of motion.  Cardiovascular: Normal rate, regular rhythm and normal heart sounds.   Pulmonary/Chest: Effort normal and breath sounds normal. No respiratory distress. She has no wheezes. She has no rhonchi.  Abdominal: Soft. Bowel sounds are normal. There is no tenderness. There is no guarding.  Musculoskeletal: Normal range of motion.  Endorses pain of right lumbar region surrounding SI joint and right hip; no acute deformities noted on exam;  - SLR bilaterally; normal strength and sensation of BLE; normal gait  Neurological: She is alert and oriented to person, place, and time.  Skin: Skin is warm and dry. She is not diaphoretic.  Psychiatric: She has a normal mood and affect.  Nursing note and vitals reviewed.   ED Course  Procedures (including critical care time) Labs Review Labs Reviewed  CBC WITH DIFFERENTIAL/PLATELET - Abnormal; Notable for the following:    RBC 3.71 (*)    Hemoglobin 10.8 (*)    HCT 33.9 (*)    All other components within normal limits  BASIC METABOLIC PANEL - Abnormal; Notable for the following:    Chloride 100 (*)    Glucose, Bld 102 (*)    BUN <5 (*)    All other components within normal limits  URINALYSIS, ROUTINE W REFLEX MICROSCOPIC (NOT AT [facility]) - Abnormal; Notable for the following:    Hgb urine dipstick TRACE (*)    All other components within normal limits  URINE MICROSCOPIC-ADD ON - Abnormal; Notable for the following:    Squamous Epithelial / LPF 0-5 (*)    All other components within normal limits    Imaging Review Ct Renal Stone Study  08/05/2015  CLINICAL DATA:  Right flank pain for 2 weeks. EXAM: CT ABDOMEN AND PELVIS WITHOUT CONTRAST TECHNIQUE: Multidetector CT imaging of the abdomen and pelvis was performed following the standard protocol without IV contrast. COMPARISON:  None. FINDINGS: Lung bases: 5.6 mm nodule lies along the diaphragmatic pleural surface of the right lower lobe. There is dependent reticular type opacity in the lower lobes, right greater than left, consistent with subsegmental atelectasis. There is a low-attenuation mass like opacity at the right posterior costophrenic sulcus at merges with the posterior right hemidiaphragm. This measures 4.6 x 1.0 x 3.1 cm. This is nonspecific. Has average Hounsfield units of 32. Liver, spleen, gallbladder, pancreas, adrenal glands:  Unremarkable. Kidneys, ureters, bladder: Unremarkable. No renal or ureteral stones. No evidence of  obstructive uropathy. Uterus and adnexa:  Unremarkable. Lymph nodes:  No adenopathy. Ascites:  None. Gastrointestinal: Unremarkable. Appendix not visualized. No evidence of appendicitis. Musculoskeletal:  No osteoblastic or osteolytic lesions. IMPRESSION: 1. No acute findings. No findings to explain right flank pain. No renal or ureteral stones or obstructive uropathy. Appendix not directly visualized but no CT evidence of appendicitis. 2. Low-density elongated masslike soft tissue abnormality at the posterior right lung base merging with the posterior right hemidiaphragm. 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 There is also 5.6 mm nodule at the base of the right lower lobe. Recommend either short-term followup, with repeat CT imaging, with contrast, in 3 months, versus further assessment nonemergent contrast-enhanced CT imaging of chest. Electronically Signed   By: Amie Portland [name]   On: 08/05/2015 19:02   I have personally reviewed and evaluated these images and lab results as part of my medical decision-making.   EKG Interpretation None      MDM  Final diagnoses:  Right flank pain  Back pain, unspecified location  Lung nodule seen on imaging study   55 y.o. F here with right low back/hip pain for the past few weeks. Patient states initially started after carrying laundry basket on her right hip.  No falls, trauma.  Patient afebrile, non-toxic.  She endorses pain of right low back/hip, no acute deformities noted on exam.  Patient ambulatory with steady gait.  No focal neurologic deficits or red flag symptoms to suggest cauda equina, SCI, epidural abscess, or other emergent pathology.  Lab work reassuring.  Patient does have noted hematuria.  CT renal study was obtained which is negative for acute urology or bony pathology, however there is incidental findings of possible bronchiogenic cyst and nodule of right lower lobe-- recommended non-emergent follow-up CT of  chest in 3 months per radiology.  Do not feel these incidental lung findings are necessarily causing patient's pain as is it localized to her right low back/hip.  Patient denies any chest pain or SOB currently, no chest or rib tenderness noted on exam.  No smoking hx or hx of cancer. She was initially tachycardic, however this resolved without intervention.  Pain of back improved with percocet, will d/c home with same.  Patient will follow-up with PCP for repeat imaging.  She does have insurance and was given resource guide in which to help find a physician-- boyfriend states he will help with this.  Discussed plan with patient, he/she acknowledged understanding and agreed with plan of care.  Return precautions given for new or worsening symptoms.   Garlon Hatchet, PA-C 08/05/15 1947

## 2015-08-05 NOTE — ED Notes (Signed)
Pt c/o right flank pain for several weeks. Pt denies any urinary symptoms, n/v/d, fevers, any other symptoms.

## 2015-08-05 NOTE — ED Notes (Signed)
Patient able to ambulate independently  

## 2015-08-05 NOTE — ED Notes (Signed)
EMT at bedside requesting urine

## 2015-08-05 NOTE — Discharge Instructions (Signed)
There was incidental finding of lung nodule and questionable cyst seen on your CT scan today.  This needs to be re-imaged in approx 3 months (April 2017).  Please follow-up with a primary care physician in the area to have this done-- see resource guide to help you find a doctor that will accept your insurance. Take the prescribed medication as directed. Return to the ED for new or worsening symptoms.   Emergency Department Resource Guide 1) Find a Doctor and Pay Out of Pocket Although you won't have to find out who is covered by your insurance plan, it is a good idea to ask around and get recommendations. You will then need to call the office and see if the doctor you have chosen will accept you as a new patient and what types of options they offer for patients who are self-pay. Some doctors offer discounts or will set up payment plans for their patients who do not have insurance, but you will need to ask so you aren't surprised when you get to your appointment.  2) Contact Your Local Health Department Not all health departments have doctors that can see patients for sick visits, but many do, so it is worth a call to see if yours does. If you don't know where your local health department is, you can check in your phone book. The CDC also has a tool to help you locate your state's health department, and many state websites also have listings of all of their local health departments.  3) Find a Walk-in Clinic If your illness is not likely to be very severe or complicated, you may want to try a walk in clinic. These are popping up all over the country in pharmacies, drugstores, and shopping centers. They're usually staffed by nurse practitioners or physician assistants that have been trained to treat common illnesses and complaints. They're usually fairly quick and inexpensive. However, if you have serious medical issues or chronic medical problems, these are probably not your best option.  No Primary  Care Doctor: - Call Health Connect at  [phone] - they can help you locate a primary care doctor that  accepts your insurance, provides certain services, etc. - Physician Referral Service- [phone]  Chronic Pain Problems: Organization         Address  Phone   Notes  [facility]  [phone] Patients need to be referred by their primary care doctor.   Medication Assistance: Organization         Address  Phone   Notes  [facility] [address] [place], [place] [address] [phone]) [phone] --Must be a resident of [place] [place] -- Must have NO insurance coverage whatsoever (no Medicaid/ Medicare, etc.) -- The pt. MUST have a primary care doctor that directs their care regularly and follows them in the community   MedAssist  [phone]) [phone]   [employer]  [phone]    Agencies that provide inexpensive medical care: Organization         Address  Phone   Notes  [facility]  [phone]   [facility]    [phone]   [facility] [address] [place], [place] [address] [phone]   [facility] [address], [place] [phone]   [facility]anned Parenthood    [phone]) [phone]   [facility]    [phone]   Community Health  and [address], [place] Phone:  [phone], Fax:  [phone] Hours of Operation:  9 am - 6 pm, M-F.  Also accepts Medicaid/Medicare and self-pay.  [facility] for [place] [address] [place], [address], [place] Phone: [phone], Fax: [phone]. Hours of Operation:  8:30 am - 5:30 pm, M-F.  Also accepts Medicaid and self-pay.  [facility] [place] [address] [place] Phone: [phone]   [place], [place], [place] [phone], Ext. [phone] Mondays & Thursdays: 7-9 AM.  First 15 patients are seen on a first  come, first serve basis.    [place] Providers:  Organization         Address  Phone   Notes  [facility] [address], [place] [phone] Also accepts self-pay patients.  [facility] [address], [place]  [phone]   [place], [address], [place] [phone]   [facility] [address] [place] [phone]   Lucianne Lei [address], [place]   [phone] Only accepts [place] Access [place] patients after they have their name applied to their card.   Self-Pay (no insurance) in [facility]:  Organization         Address  Phone   Notes  Sickle Cell Patients, [facility] [place] [phone]   [facility] [place] [phone]   [facility] [place]  [place], [place], [place] [phone]   [facility]/Dr. Osei-Bonsu  [address] [place] or [address], [place] [phone] Phone number for both [place] and [place] locations is the same.  [facility] [address] [place] [phone]   [facility] [address], [place] or [address] [phone]   [facility] [address], [place] [phone], phone; [phone], fax Sees patients 1st and 3rd Saturday of every month.  Must not qualify for public or private insurance (i.e. Medicaid, Medicare, [place] Health Choice, Veterans' Benefits)  Household income should be no more than 200% of the poverty level The clinic cannot treat you if you are pregnant or think you are pregnant  Sexually transmitted diseases are not treated at the clinic.    Dental Care: Organization          Address  Phone  Notes  [facility] [place] [phone] Accepts children up to age 79 who are enrolled in [place] or [place]; pregnant women with a Medicaid card; and children who have applied for Medicaid or [place] Health Choice, but were declined, whose parents can pay a reduced fee at time of service.  [facility] Department of [facility]  [address], [place] [phone] Accepts children up to age 42 who are enrolled in [place] or [place]; pregnant women with a Medicaid card; and children who have applied for Medicaid or [place] Health Choice, but were declined, whose parents can pay a reduced fee at time of service.  [facility]  [address]  Mardene Speak [phone] Patients are seen by appointment only. Walk-ins are not accepted. [place] will see patients 60 years of age and older. Monday - Tuesday (8am-5pm) Most Wednesdays (8:30-5pm) $30 per visit, cash only  [facility] Adult [facility] Access PROGRAM  [address], [facility] [phone] Patients are seen by appointment only. Walk-ins are not accepted. Clarissa will see patients 9 years of age and older. One Wednesday Evening (Monthly: Volunteer Based).  $30 per visit, cash only  [place]  [phone] for adults; Children under age 8, call Graduate Pediatric Dentistry at [phone]. Children aged 87-14, please call [phone] to request a pediatric application.  Dental services are provided in all areas of dental care including fillings, crowns and bridges, complete and partial dentures, implants, gum treatment, root canals, and extractions. Preventive care is also provided. Treatment is provided to both adults and children. Patients are selected via a lottery and there is often a waiting list.   [facility] [address], [place]  [phone] [email or web address]   [facility] [address] [place], [place] [phone], Ext. [phone] Second and Fourth Thursday of each month, opens at 6:30 AM; Clinic ends at 9 AM.  Patients are seen on a first-come first-served basis, and a limited number are seen during each clinic.   [facility]  [address] Hillard Danker [place], [place] [phone]   Eligibility Requirements You must have lived in [place], [place], or [place] counties for at least the last three months.   You cannot be eligible for state or federal sponsored [employer], including [employer], [place], or [employer].   You generally cannot be eligible for healthcare insurance through your employer.    How to apply: Eligibility screenings are held every Tuesday and Wednesday afternoon from 1:00 pm until 4:00 pm. You do not need an appointment for the interview!  [facility] [address] [place], [place]   [place]  [place] Department  [place]  [phone]    Behavioral Health Resources in the Community: Intensive Outpatient Programs Organization         Address  Phone  Notes  [facility] [address], [place], [place] [phone]   [facility] Outpatient [address], [place], [place]   ADS: Alcohol & Drug Svcs [address], [place], [place]   [place] [address]  [place], [place] or [phone]   Substance Abuse Resources Organization         Address  Phone  Notes  Alcohol and Drug Services  [phone]   [place]  [phone]   [facility] [place]   Chinita Pester  [phone]   Residential & Outpatient Substance Abuse Program  [phone]   Psychological  Services Organization         Address  Phone  Notes  [facility]  [place]  [phone]   [place] [address] [place] or [phone]    Mobile Crisis Teams Organization         Address  Phone  Notes  [facility], Mobile Crisis Care Unit  [phone]   [facility]ive Psychotherapeutic Services  [address] [place], [place]   [place]  [place] [phone]    Self-Help/Support Groups Organization         Address  Phone             Notes  [facility] of [place] - variety of support groups  [place] Call for more information  Narcotics Anonymous (NA), [facility]aring [facility] [address], [employer] [place]  2 meetings at this location   Special educational needs teacher         Address  Phone  Notes  [facility] [place],    [place]  [phone]   [facility]  [address] [place] [phone], [employer], [place]   [place], [place] [phone] Admissions: 8am-3pm M-F  Incentives Substance [place] [address],    [place], [place] [phone]   [facility] [address] [place], [place], [place]   [facility] [address],  [place], [place]   [facility] - Intensive Outpatient [address], [place], [place]   [facility] [place] ([place].) [place] [address].,  [place], [place] [phone] or [phone]   [facility]idential Treatment Services (RTS) [address] [place], [place] Accepts Medicaid  [facility] [place] [address]  [place] [phone] Substance Abuse/Addiction Treatment   [facility] Organization         Address  Phone  Notes  [facility]  [phone]   Domenic Schwab, PhD [address] Arlis Porta [place], [place]   [phone] or [phone]   [place], [place] [phone]   [facility] [address] [place], [place] [phone] Insurance/Medicaid/sponsorship through [facility] and Families [address]                                    [place], [place] [phone] [place] [address], [place] [phone]    Dr. Adele Schilder  [phone]   [facility] Dept. 1) [address], [place] 2) [place] 3)  [place] [address], [place] [phone]  [phone]   [place] [phone] or [phone] (After Hours)

## 2015-11-02 NOTE — ED Provider Notes (Signed)
1/27  Medical screening examination/treatment/procedure(s) were performed by non-physician practitioner and as supervising physician I was immediately available for consultation/collaboration.   EKG Interpretation None        Rolland PorterMark Bessie Boyte, MD 11/02/15 2355

## 2016-04-03 ENCOUNTER — Ambulatory Visit: Payer: Medicaid Other | Admitting: Physical Therapy

## 2017-06-03 ENCOUNTER — Encounter (HOSPITAL_COMMUNITY): Payer: Self-pay | Admitting: Emergency Medicine

## 2017-06-03 ENCOUNTER — Ambulatory Visit (HOSPITAL_COMMUNITY)
Admission: EM | Admit: 2017-06-03 | Discharge: 2017-06-03 | Disposition: A | Discharge status: Home / Self Care | Payer: BLUE CROSS/BLUE SHIELD | Attending: Emergency Medicine | Admitting: Emergency Medicine

## 2017-06-03 DIAGNOSIS — W57XXXA Bitten or stung by nonvenomous insect and other nonvenomous arthropods, initial encounter: Secondary | ICD-10-CM

## 2017-06-03 DIAGNOSIS — S60862A Insect bite (nonvenomous) of left wrist, initial encounter: Secondary | ICD-10-CM | POA: Diagnosis not present

## 2017-06-03 MED ORDER — TRIAMCINOLONE ACETONIDE 0.1 % EX CREA: 1.0000 "application " | TOPICAL_CREAM | Freq: Two times a day (BID) | CUTANEOUS | 0 refills | Status: AC

## 2017-06-03 NOTE — ED Provider Notes (Signed)
[facility]    CSN: [id number] Arrival date & time: 06/03/17  1833     History   Chief Complaint Chief Complaint  Patient presents with  . Insect Bite    HPI Kelly [name] is a 56 y.o. female.   56 year old female with arthritis comes in for 2 day history of left wrist swelling and itching after insect bite. Patient states has been itching area, which is now with erythema and some warmth. Denies fever, chills, night sweats. Denies spreading erythema, pain. Has not taken anything for the symptoms. Denies shortness of breath, swelling of throat, trouble breathing, trouble swallowing.       Past Medical History:  Diagnosis Date  . Arthritis     There are no active problems to display for this patient.   History reviewed. No pertinent surgical history.  OB History    No data available       Home Medications    Prior to Admission medications   Medication Sig Start Date End Date Taking? Authorizing Provider  acetaminophen (TYLENOL) 500 MG tablet Take 1,000 mg by mouth every 6 (six) hours as needed for pain.    [provider]  aspirin-acetaminophen-caffeine (EXCEDRIN EXTRA STRENGTH) [phone] MG tablet Take 1 tablet by mouth every 6 (six) hours as needed for headache.    [provider]  cholecalciferol (VITAMIN D) 1000 UNITS tablet Take 1,000 Units by mouth daily.    [provider]  diphenhydrAMINE (BENADRYL) 25 mg capsule Take 25 mg by mouth every 6 (six) hours as needed for allergies.    [provider]  etodolac (LODINE) 400 MG tablet Take 400 mg by mouth 2 (two) times daily as needed (for pain).    [provider]  Multiple Vitamin (MULTIVITAMIN WITH MINERALS) TABS Take 1 tablet by mouth daily.    [provider]  oxyCODONE-acetaminophen (PERCOCET/ROXICET) 5-325 MG tablet Take 1 tablet by mouth every 4 (four) hours as needed. 08/05/15   Garlon Hatchet, PA-C  triamcinolone cream (KENALOG) 0.1 % Apply  1 application 2 (two) times daily topically. 06/03/17   Belinda Fisher, PA-C    Family History No family history on file.  Social History Social History   Tobacco Use  . Smoking status: Never Smoker  Substance Use Topics  . Alcohol use: No  . Drug use: No     Allergies   Patient has no known allergies.   Review of Systems Review of Systems  Reason unable to perform ROS: See HPI as above.     Physical Exam Triage Vital Signs ED Triage Vitals  Enc Vitals Group     BP 06/03/17 1934 125/77     Pulse Rate 06/03/17 1934 82     Resp 06/03/17 1934 18     Temp 06/03/17 1934 98 F (36.7 C)     Temp Source 06/03/17 1934 Oral     SpO2 06/03/17 1934 100 %     Weight --      Height --      Head Circumference --      Peak Flow --      Pain Score 06/03/17 1932 6     Pain Loc --      Pain Edu? --      Excl. in GC? --    No data found.  Updated Vital Signs BP 125/77 (BP Location: Right Arm)   Pulse 82   Temp 98 F (36.7 C) (Oral)  Resp 18   LMP 10/05/2012   SpO2 100%   Physical Exam  Constitutional: She is oriented to person, place, and time. She appears well-developed and well-nourished. No distress.  HENT:  Head: Normocephalic and atraumatic.  Eyes: Conjunctivae are normal. Pupils are equal, round, and reactive to light.  Pulmonary/Chest: Effort normal. No respiratory distress.  Neurological: She is alert and oriented to person, place, and time.  Skin:  Mild swelling with surrounding erythema at the left ulnar wrist. No tenderness to palpation, no obvious increased warmth.     UC Treatments / Results  Labs (all labs ordered are listed, but only abnormal results are displayed) Labs Reviewed - No data to display  EKG  EKG Interpretation None       Radiology No results found.  Procedures Procedures (including critical care time)  Medications Ordered in UC Medications - No data to display   Initial Impression / Assessment and Plan / UC Course  I  have reviewed the triage vital signs and the nursing notes.  Pertinent labs & imaging results that were available during my care of the patient were reviewed by me and considered in my medical decision making (see chart for details).    Discussed no signs of infection today. Triamcinolone cream on affected area. otc antihistamine for itching. Ice compress for swelling/itching. Return precautions given. Patient expresses understanding and agrees to plan.   Final Clinical Impressions(s) / UC Diagnoses   Final diagnoses:  Insect bite, initial encounter    ED Discharge Orders        Ordered    triamcinolone cream (KENALOG) 0.1 %  2 times daily     06/03/17 2031        Belinda FisherYu, Amy V, PA-C 06/03/17 2116

## 2017-06-03 NOTE — Discharge Instructions (Signed)
No signs of skin infection today. Continue benadryl for itching, you can add zyrtec as needed if itching continues. Apply triamcinolone cream as directed. Ice compress for swelling and itching. Monitor for spreading redness, increased warmth, increased pain, fever, follow up for reevaluation.

## 2017-06-03 NOTE — ED Triage Notes (Signed)
Patient reports insect bite occurred yesterday.  Left wrist with swelling , slight redness, warm to touch.  Area does itch

## 2020-06-26 DIAGNOSIS — Z01419 Encounter for gynecological examination (general) (routine) without abnormal findings: Secondary | ICD-10-CM | POA: Diagnosis not present

## 2020-06-26 DIAGNOSIS — Z681 Body mass index (BMI) 19 or less, adult: Secondary | ICD-10-CM | POA: Diagnosis not present

## 2020-08-07 DIAGNOSIS — M4716 Other spondylosis with myelopathy, lumbar region: Secondary | ICD-10-CM | POA: Diagnosis not present

## 2020-08-07 DIAGNOSIS — J302 Other seasonal allergic rhinitis: Secondary | ICD-10-CM | POA: Diagnosis not present

## 2020-08-07 DIAGNOSIS — Z1239 Encounter for other screening for malignant neoplasm of breast: Secondary | ICD-10-CM | POA: Diagnosis not present

## 2020-08-28 DIAGNOSIS — Z1231 Encounter for screening mammogram for malignant neoplasm of breast: Secondary | ICD-10-CM | POA: Diagnosis not present

## 2021-02-07 DIAGNOSIS — R6889 Other general symptoms and signs: Secondary | ICD-10-CM | POA: Diagnosis not present

## 2021-02-07 DIAGNOSIS — Z131 Encounter for screening for diabetes mellitus: Secondary | ICD-10-CM | POA: Diagnosis not present

## 2021-02-07 DIAGNOSIS — M4716 Other spondylosis with myelopathy, lumbar region: Secondary | ICD-10-CM | POA: Diagnosis not present

## 2021-02-07 DIAGNOSIS — Z0000 Encounter for general adult medical examination without abnormal findings: Secondary | ICD-10-CM | POA: Diagnosis not present

## 2021-02-07 DIAGNOSIS — E559 Vitamin D deficiency, unspecified: Secondary | ICD-10-CM | POA: Diagnosis not present

## 2021-02-07 DIAGNOSIS — Z13220 Encounter for screening for lipoid disorders: Secondary | ICD-10-CM | POA: Diagnosis not present

## 2021-02-07 DIAGNOSIS — J302 Other seasonal allergic rhinitis: Secondary | ICD-10-CM | POA: Diagnosis not present

## 2021-07-19 DIAGNOSIS — Z13820 Encounter for screening for osteoporosis: Secondary | ICD-10-CM | POA: Diagnosis not present

## 2021-07-19 DIAGNOSIS — Z681 Body mass index (BMI) 19 or less, adult: Secondary | ICD-10-CM | POA: Diagnosis not present

## 2021-07-19 DIAGNOSIS — Z01419 Encounter for gynecological examination (general) (routine) without abnormal findings: Secondary | ICD-10-CM | POA: Diagnosis not present

## 2021-09-03 DIAGNOSIS — Z1231 Encounter for screening mammogram for malignant neoplasm of breast: Secondary | ICD-10-CM | POA: Diagnosis not present

## 2022-07-28 ENCOUNTER — Encounter (HOSPITAL_COMMUNITY): Payer: Self-pay

## 2022-07-28 ENCOUNTER — Ambulatory Visit (HOSPITAL_COMMUNITY)
Admission: EM | Admit: 2022-07-28 | Discharge: 2022-07-28 | Disposition: A | Discharge status: Home / Self Care | Payer: BLUE CROSS/BLUE SHIELD | Attending: Emergency Medicine | Admitting: Emergency Medicine

## 2022-07-28 DIAGNOSIS — H60501 Unspecified acute noninfective otitis externa, right ear: Secondary | ICD-10-CM

## 2022-07-28 MED ORDER — CIPROFLOXACIN-DEXAMETHASONE 0.3-0.1 % OT SUSP: 4.0000 [drp] | Freq: Two times a day (BID) | OTIC | 0 refills | Status: AC

## 2022-07-28 NOTE — ED Triage Notes (Signed)
Pt is here for right ear pain x4days .  

## 2022-07-28 NOTE — Discharge Instructions (Signed)
Use the ear drops as prescribed. Pull the top of your ear back to open up the canal and ensure the drops get inside.  Please return if symptoms do not improve after the medicine.

## 2022-07-28 NOTE — ED Provider Notes (Signed)
[facility]    CSN: [id number] Arrival date & time: 07/28/22  1108      History   Chief Complaint Chief Complaint  Patient presents with   Otalgia    HPI Kelly [name] is a 61 y.o. female.  Presents with right ear pain x 4 days Denies any drainage or fevers Puts cotton in her ears when washing her hair  Reports history of ear/hearing problems Wears hearing aid on the left. 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  History reviewed. No pertinent past medical history.  There are no problems to display for this patient.   History reviewed. No pertinent surgical history.  OB History   No obstetric history on file.      Home Medications    Prior to Admission medications   Medication Sig Start Date End Date Taking? Authorizing Provider  ciprofloxacin-dexamethasone (CIPRODEX) OTIC suspension Place 4 drops into the right ear 2 (two) times daily for 5 days. 07/28/22 08/02/22 Yes Jezreel Sisk, Ray Church    Family History History reviewed. No pertinent family history.  Social History Social History   Tobacco Use   Smoking status: Never    Passive exposure: Never   Smokeless tobacco: Never  Vaping Use   Vaping Use: Never used  Substance Use Topics   Alcohol use: Never   Drug use: Never     Allergies   Patient has no known allergies.   Review of Systems Review of Systems  HENT:  Positive for ear pain.    Per HPI  Physical Exam Triage Vital Signs ED Triage Vitals  Enc Vitals Group     BP 07/28/22 1218 126/85     Pulse Rate 07/28/22 1218 81     Resp 07/28/22 1218 12     Temp 07/28/22 1218 98.1 F (36.7 C)     Temp Source 07/28/22 1218 Oral     SpO2 07/28/22 1218 95 %     Weight --      Height --      Head Circumference --      Peak Flow --      Pain Score 07/28/22 1213 5     Pain Loc --      Pain Edu? --       Excl. in GC? --    No data found.  Updated Vital Signs BP 126/85 (BP Location: Right Arm)   Pulse 81   Temp 98.1 F (36.7 C) (Oral)   Resp 12   LMP  (LMP Unknown)   SpO2 95%    Physical Exam Vitals and nursing note reviewed.  Constitutional:      General: She is not in acute distress. HENT:     Right Ear: Swelling present. No drainage.     Left Ear: External ear normal.     Ears:     Comments: R ear canal very narrow, consistent with hx of stenosis. There is swelling and irritation of the canal. A little wax but does not appear impacted. Left ear with hearing aid in place.    Mouth/Throat:     Mouth: Mucous membranes are moist.     Pharynx: Oropharynx is clear.  Eyes:     Conjunctiva/sclera: Conjunctivae normal.  Cardiovascular:     Rate and Rhythm: Normal rate and regular rhythm.  Pulmonary:     Effort: Pulmonary  effort is normal.  Neurological:     Mental Status: She is alert and oriented to person, place, and time.     UC Treatments / Results  Labs (all labs ordered are listed, but only abnormal results are displayed) Labs Reviewed - No data to display  EKG  Radiology No results found.  Procedures Procedures  Medications Ordered in UC Medications - No data to display  Initial Impression / Assessment and Plan / UC Course  I have reviewed the triage vital signs and the nursing notes.  Pertinent labs & imaging results that were available during my care of the patient were reviewed by me and considered in my medical decision making (see chart for details).  AOE right Ciprodex BID x 5 days Difficult exam with narrow canal. Recommend to pull back on the helix of ear to expose the canal and make sure drops enter as best they can.  Recommend follow with her ENT if symptoms persist or any acute hearing changes occur. Patient agrees to plan.  Final Clinical Impressions(s) / UC Diagnoses   Final diagnoses:  Acute otitis externa of right ear, unspecified  type     Discharge Instructions      Use the ear drops as prescribed. Pull the top of your ear back to open up the canal and ensure the drops get inside.  Please return if symptoms do not improve after the medicine.    ED Prescriptions     Medication Sig Dispense Auth. Provider   ciprofloxacin-dexamethasone (CIPRODEX) OTIC suspension Place 4 drops into the right ear 2 (two) times daily for 5 days. 7.5 mL Bohdi Leeds, Lurena Joiner, PA-C      PDMP not reviewed this encounter.   Marlow Baars, [place] 07/28/22 1327

## 2022-09-02 DIAGNOSIS — Z01419 Encounter for gynecological examination (general) (routine) without abnormal findings: Secondary | ICD-10-CM | POA: Diagnosis not present

## 2022-09-02 DIAGNOSIS — R319 Hematuria, unspecified: Secondary | ICD-10-CM | POA: Diagnosis not present

## 2022-09-02 DIAGNOSIS — Z681 Body mass index (BMI) 19 or less, adult: Secondary | ICD-10-CM | POA: Diagnosis not present

## 2022-09-04 DIAGNOSIS — Z1231 Encounter for screening mammogram for malignant neoplasm of breast: Secondary | ICD-10-CM | POA: Diagnosis not present
